# Patient Record
Sex: Female | Born: 1985 | Race: White | Hispanic: No
Health system: Midwestern US, Community
[De-identification: ages and names within clinical notes are randomized; demographics above are authoritative.]

## PROBLEM LIST (undated history)

## (undated) DIAGNOSIS — E079 Disorder of thyroid, unspecified: Secondary | ICD-10-CM

## (undated) DIAGNOSIS — F32A Depression, unspecified: Secondary | ICD-10-CM

## (undated) HISTORY — DX: Depression, unspecified: F32.A

## (undated) HISTORY — DX: Disorder of thyroid, unspecified: E07.9

---
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
## 2013-04-17 DIAGNOSIS — C50919 Malignant neoplasm of unspecified site of unspecified female breast: Secondary | ICD-10-CM

## 2013-04-17 HISTORY — PX: MASTECTOMY: SHX3

## 2013-04-17 HISTORY — DX: Malignant neoplasm of unspecified site of unspecified female breast: C50.919

## 2014-04-07 DIAGNOSIS — F17210 Nicotine dependence, cigarettes, uncomplicated: Secondary | ICD-10-CM | POA: Insufficient documentation

## 2014-04-17 DIAGNOSIS — G35 Multiple sclerosis: Secondary | ICD-10-CM

## 2014-04-20 DIAGNOSIS — E039 Hypothyroidism, unspecified: Secondary | ICD-10-CM | POA: Insufficient documentation

## 2014-05-19 DIAGNOSIS — Z7189 Other specified counseling: Secondary | ICD-10-CM | POA: Insufficient documentation

## 2014-10-14 DIAGNOSIS — Z9013 Acquired absence of bilateral breasts and nipples: Secondary | ICD-10-CM | POA: Insufficient documentation

## 2015-04-01 DIAGNOSIS — F32A Depression, unspecified: Secondary | ICD-10-CM | POA: Insufficient documentation

## 2015-04-01 DIAGNOSIS — C50911 Malignant neoplasm of unspecified site of right female breast: Secondary | ICD-10-CM | POA: Insufficient documentation

## 2015-07-22 DIAGNOSIS — N6320 Unspecified lump in the left breast, unspecified quadrant: Secondary | ICD-10-CM | POA: Insufficient documentation

## 2015-09-30 DIAGNOSIS — F419 Anxiety disorder, unspecified: Secondary | ICD-10-CM | POA: Insufficient documentation

## 2016-04-17 HISTORY — PX: BREAST RECONSTRUCTION: SHX9

## 2016-08-04 DIAGNOSIS — K219 Gastro-esophageal reflux disease without esophagitis: Secondary | ICD-10-CM | POA: Insufficient documentation

## 2016-08-04 DIAGNOSIS — Z87891 Personal history of nicotine dependence: Secondary | ICD-10-CM | POA: Insufficient documentation

## 2016-08-07 DIAGNOSIS — G4730 Sleep apnea, unspecified: Secondary | ICD-10-CM | POA: Insufficient documentation

## 2016-08-07 DIAGNOSIS — D649 Anemia, unspecified: Secondary | ICD-10-CM | POA: Insufficient documentation

## 2017-07-02 DIAGNOSIS — T8130XA Disruption of wound, unspecified, initial encounter: Secondary | ICD-10-CM | POA: Insufficient documentation

## 2017-07-02 DIAGNOSIS — Z853 Personal history of malignant neoplasm of breast: Secondary | ICD-10-CM | POA: Insufficient documentation

## 2017-07-02 DIAGNOSIS — T81321A Disruption or dehiscence of closure of internal operation (surgical) wound of abdominal wall muscle or fascia, initial encounter: Secondary | ICD-10-CM | POA: Insufficient documentation

## 2018-05-01 DIAGNOSIS — R9082 White matter disease, unspecified: Secondary | ICD-10-CM | POA: Insufficient documentation

## 2019-02-21 DIAGNOSIS — G8929 Other chronic pain: Secondary | ICD-10-CM | POA: Insufficient documentation

## 2019-04-18 DIAGNOSIS — G35 Multiple sclerosis: Secondary | ICD-10-CM

## 2019-04-18 HISTORY — DX: Multiple sclerosis: G35

## 2019-12-19 DIAGNOSIS — Z79899 Other long term (current) drug therapy: Secondary | ICD-10-CM | POA: Insufficient documentation

## 2019-12-19 DIAGNOSIS — G959 Disease of spinal cord, unspecified: Secondary | ICD-10-CM | POA: Insufficient documentation

## 2019-12-19 DIAGNOSIS — Z7960 Long term (current) use of unspecified immunomodulators and immunosuppressants: Secondary | ICD-10-CM | POA: Insufficient documentation

## 2020-01-23 DIAGNOSIS — D8489 Other immunodeficiencies: Secondary | ICD-10-CM | POA: Insufficient documentation

## 2020-02-23 DIAGNOSIS — F332 Major depressive disorder, recurrent severe without psychotic features: Secondary | ICD-10-CM | POA: Insufficient documentation

## 2020-02-23 DIAGNOSIS — F411 Generalized anxiety disorder: Secondary | ICD-10-CM | POA: Insufficient documentation

## 2020-08-02 ENCOUNTER — Ambulatory Visit: Attending: Family | Primary: Family

## 2020-08-02 ENCOUNTER — Ambulatory Visit: Admit: 2020-08-02 | Discharge: 2020-08-02 | Payer: PRIVATE HEALTH INSURANCE | Attending: Family | Primary: Family

## 2020-08-02 DIAGNOSIS — R238 Other skin changes: Secondary | ICD-10-CM | POA: Insufficient documentation

## 2020-08-02 DIAGNOSIS — F909 Attention-deficit hyperactivity disorder, unspecified type: Secondary | ICD-10-CM | POA: Insufficient documentation

## 2020-08-02 DIAGNOSIS — G35 Multiple sclerosis: Secondary | ICD-10-CM

## 2020-08-02 NOTE — Progress Notes (Signed)
Progress  Notes by Darleen Crocker, NP at 08/02/20 1300                Author: Darleen Crocker, NP  Service: --  Author Type: Nurse Practitioner       Filed: 08/03/20 1126  Encounter Date: 08/02/2020  Status: Signed          Editor: Darleen Crocker, NP (Nurse Practitioner)                    HPI          Chief Complaint       Patient presents with        ?  Establish Care             establish care, discuss mole on back and leg.            HPI:   April Torres is a 35 y.o.  female who is  Here to establish care with a new provider.       Last 6 months [facility] out of [place].       Breast cancer oncologist is from [place]. Hx of breast CA: Double mastectomy: Chemo completed.  Per the patient she was stage 2, now cancer  free. Finished chemotherapy in 2020.        Multiple sclerosis MS: Previously diagnosed with multiple sclerosis.  Patient was followed by neurology in her home state of [place].  Patient  states she was on gabapentin.  No recent relapses.  No urinary symptoms, no visual impairment, notes fatigue.      ADHD:   Patient  complaining of symptoms of inattention, impulsive activity, restlessness, functional impairment.         Insomnia:  Patient has not been using medication for insomnia. Patient has been limiting caffeine intake and stimulation.  Denies over sedation  during the day.  Gets less than 6-8 hours of sleep at night.       Hyperpigmentation : patient has several areas of hyperpigmentation noted to the left leg and shoulder.  Has not been evaluated for the same  in the past.  Patient endorses previous episodes of sunburn.  Patient is light complected        Allergies        Allergen  Reactions         ?  Compazine [Prochlorperazine]  Other (comments)             Lockjaw             Current Outpatient Medications        Medication  Sig         ?  venlafaxine-ER 24 HR (EFFEXOR-ER) 37.5 mg tr24 tablet  Take 37.5 mg by mouth daily.         ?  venlafaxine-SR (EFFEXOR-XR) 75 mg  capsule  Take 75 mg by mouth daily.          No current facility-administered medications for this visit.           Review of Systems    Constitutional: Negative for malaise/fatigue and weight loss.    Eyes: Negative for blurred vision and double vision.    Respiratory: Negative for cough and shortness of breath.     Cardiovascular: Negative for chest pain, palpitations and leg swelling.    Gastrointestinal: Negative for heartburn and nausea.    Musculoskeletal: Negative for joint pain and myalgias.    Skin: Negative  for itching and rash.    Neurological: Negative for dizziness, tingling, loss of consciousness, weakness and headaches.    Endo/Heme/Allergies: Does not bruise/bleed easily.    Psychiatric/Behavioral: Negative for depression. The patient is not nervous/anxious.     All other systems reviewed and are negative.         Reviewed PmHx, FmHx, SocHx as well as meds and allergies, updated and dated in the chart.              Objective        Visit Vitals      BP  117/73 (BP 1 Location: Right arm, BP Patient Position: Sitting, BP Cuff Size: Adult)        Pulse  76     Temp  98.3 ??F (36.8 ??C) (Oral)     Resp  16     Ht  5\' 5"  (1.651 m)     Wt  201 lb 1.6 oz (91.2 kg)     SpO2  99%        BMI  33.46 kg/m??        Physical Exam   Vitals and nursing note reviewed.   Constitutional :        Appearance: Normal appearance. She is normal weight.    HENT:       Head: Normocephalic.    Eyes:       Extraocular Movements: Extraocular movements intact.      Conjunctiva/sclera: Conjunctivae normal.      Pupils: Pupils are equal, round, and reactive to light.    Cardiovascular:       Rate and Rhythm: Normal rate and regular rhythm.      Pulses: Normal pulses.      Heart sounds: Normal heart sounds.   Pulmonary:       Effort: Pulmonary effort is normal.      Breath sounds: Normal breath sounds.   Musculoskeletal:          General: Normal range of motion.      Cervical back: Normal range of motion and neck supple.     Skin:       General: Skin is warm and dry.               Comments: Several areas of hypopigmentation which is a new finding for the patient.     Neurological:       General: No focal deficit present.      Mental Status: She is alert and oriented to person, place, and time.   Psychiatric:         Mood and Affect: Mood normal.                   Assessment and Plan        Diagnoses and all orders for this visit:      1. Multiple sclerosis (HCC)   Assessment & Plan:    well controlled, continue current medications      Orders:   -     REFERRAL TO NEUROLOGY      2. Abnormal skin color   Assessment & Plan:    unclear control, changes made today: Referral to dermatology.      Orders:   -     REFERRAL TO DERMATOLOGY      3. Attention deficit hyperactivity disorder (ADHD), unspecified ADHD type   Assessment & Plan:    uncontrolled, changes made today: ref to psy for testing anxiety vs ADHD  Orders:   -     REFERRAL TO PSYCHIATRY      4. Anxiety   Assessment & Plan:    borderline controlled, changes made today: Referral to psychiatry for testing anxiety versus ADHD.         5. Bilateral malignant neoplasm of breast in female, unspecified estrogen receptor status, unspecified site of breast [facility])   Assessment & Plan:    asymptomatic, continue current medications             Medication Side Effects and Warnings were discussed with patient.   Patient Labs were reviewed and or requested.   Patient Past Records were reviewed and or requested.        Follow-up and Dispositions      ??  Return in about 3 months (around 11/01/2020) for follow up.                Please note that this dictation was completed with Dragon, the computer voice recognition software.  Quite often unanticipated grammatical, syntax, homophones, and other interpretive errors are inadvertently transcribed by the computer software.  Please  disregard these errors.  Please excuse any errors that have escaped final proofreading.           I have discussed the diagnosis with  the patient and the intended plan as seen in the above orders. The patient has received an after-visit summary and questions were answered concerning future plans.  I have discussed medication side effects and warnings  with the patient as well.      Arrionna Serena M. Azucena Kuba, FNP   [address] [facility]   [id number] [address] [facility]

## 2020-08-03 NOTE — Assessment & Plan Note (Signed)
unclear control, changes made today: Referral to dermatology.

## 2020-08-03 NOTE — Assessment & Plan Note (Signed)
asymptomatic, continue current medications

## 2020-08-03 NOTE — Assessment & Plan Note (Signed)
borderline controlled, changes made today: Referral to psychiatry for testing anxiety versus ADHD.

## 2020-08-03 NOTE — Assessment & Plan Note (Signed)
uncontrolled, changes made today: ref to psy for testing anxiety vs ADHD

## 2020-08-03 NOTE — Assessment & Plan Note (Signed)
well controlled, continue current medications

## 2020-08-06 ENCOUNTER — Encounter: Attending: Family | Primary: Family

## 2020-08-17 DIAGNOSIS — E669 Obesity, unspecified: Secondary | ICD-10-CM | POA: Insufficient documentation

## 2020-08-17 DIAGNOSIS — F331 Major depressive disorder, recurrent, moderate: Secondary | ICD-10-CM | POA: Insufficient documentation

## 2020-08-24 DIAGNOSIS — R110 Nausea: Secondary | ICD-10-CM | POA: Insufficient documentation

## 2020-10-26 ENCOUNTER — Other Ambulatory Visit: Payer: Self-pay | Admitting: Surgery

## 2020-10-26 DIAGNOSIS — K838 Other specified diseases of biliary tract: Secondary | ICD-10-CM

## 2020-11-01 ENCOUNTER — Encounter: Attending: Family | Primary: Family

## 2020-11-09 ENCOUNTER — Other Ambulatory Visit (HOSPITAL_COMMUNITY): Payer: Self-pay | Admitting: Family Medicine

## 2020-11-09 ENCOUNTER — Other Ambulatory Visit: Payer: Self-pay | Admitting: Family Medicine

## 2020-11-09 DIAGNOSIS — R110 Nausea: Secondary | ICD-10-CM

## 2020-11-09 DIAGNOSIS — R1011 Right upper quadrant pain: Secondary | ICD-10-CM

## 2020-11-12 ENCOUNTER — Other Ambulatory Visit: Payer: Self-pay

## 2020-11-12 ENCOUNTER — Ambulatory Visit
Admission: RE | Admit: 2020-11-12 | Discharge: 2020-11-12 | Disposition: A | Discharge status: Home / Self Care | Payer: 59 | Source: Ambulatory Visit | Attending: Family Medicine | Admitting: Family Medicine

## 2020-11-12 DIAGNOSIS — R1011 Right upper quadrant pain: Secondary | ICD-10-CM | POA: Diagnosis not present

## 2020-11-12 DIAGNOSIS — R110 Nausea: Secondary | ICD-10-CM | POA: Diagnosis present

## 2020-12-06 ENCOUNTER — Ambulatory Visit: Payer: 59 | Admitting: Surgery

## 2020-12-06 DIAGNOSIS — F3341 Major depressive disorder, recurrent, in partial remission: Secondary | ICD-10-CM | POA: Insufficient documentation

## 2020-12-08 ENCOUNTER — Encounter: Payer: Self-pay | Admitting: Surgery

## 2020-12-08 ENCOUNTER — Other Ambulatory Visit: Payer: Self-pay

## 2020-12-08 ENCOUNTER — Telehealth: Payer: Self-pay | Admitting: Surgery

## 2020-12-08 ENCOUNTER — Ambulatory Visit: Payer: 59 | Admitting: Surgery

## 2020-12-08 VITALS — BP 109/75 | HR 89 | Temp 98.7°F | Ht 65.0 in | Wt 200.0 lb

## 2020-12-08 DIAGNOSIS — L732 Hidradenitis suppurativa: Secondary | ICD-10-CM | POA: Diagnosis not present

## 2020-12-08 NOTE — Telephone Encounter (Signed)
Patient has been advised of Pre-Admission date/time, COVID Testing date and Surgery date.  Surgery Date: 12/13/20 Preadmission Testing Date: 12/10/20 (phone 1p-5p) Covid Testing Date: Not needed.     Patient has been made aware to call [phone], between 1-3:00pm the day before surgery, to find out what time to arrive for surgery.

## 2020-12-08 NOTE — Patient Instructions (Signed)
We will refer you to Dermatology to see about care for Hidradenitis. They will call you to schedule this appointment.   We want you to go ahead and take your Bactrim prescription to keep down any infection prior to surgery.   We have spoken to you about having surgery. This will be done at [facility] by Dr Hampton Abbot.  Our surgery scheduler will call you to look at surgery dates and to go over information. Please see your Blue surgery sheet for more information.    Hidradenitis Suppurativa Hidradenitis suppurativa is a long-term (chronic) skin disease that starts with blocked sweat glands or hair follicles. Bacteria may grow in these blocked openings of your skin. Hidradenitis suppurativa is like a severe form of acne that develops in areas of your body where acne would be unusual. It is most likely to affect the areas of your body where skin rubs against skin and becomes moist. This includes your: Underarms. Groin. Genital areas. Buttocks. Upper thighs. Breasts. Hidradenitis suppurativa may start out with small pimples. The pimples can develop into deep sores that break open (rupture) and drain pus. Over time your skin may thicken and become scarred. Hidradenitis suppurativa cannot be passed from person to person.  CAUSES  The exact cause of hidradenitis suppurativa is not known. This condition may be due to: Female and female hormones. The condition is rare before and after puberty. An overactive body defense system (immune system). Your immune system may overreact to the blocked hair follicles or sweat glands and cause swelling and pus-filled sores. RISK FACTORS You may have a higher risk of hidradenitis suppurativa if you: Are a woman. Are between ages 79 and 78. Have a family history of hidradenitis suppurativa. Have a personal history of acne. Are overweight. Smoke. Take the drug lithium. SIGNS AND SYMPTOMS  The first signs of an outbreak are usually painful skin bumps that look like  pimples. As the condition progresses: Skin bumps may get bigger and grow deeper into the skin. Bumps under the skin may rupture and drain smelly pus. Skin may become itchy and infected. Skin may thicken and scar. Drainage may continue through tunnels under the skin (fistulas). Walking and moving your arms can become painful. DIAGNOSIS  Your health care provider may diagnose hidradenitis suppurativa based on your medical history and your signs and symptoms. A physical exam will also be done. You may need to see a health care provider who specializes in skin diseases (dermatologist). You may also have tests done to confirm the diagnosis. These can include: Swabbing a sample of pus or drainage from your skin so it can be sent to the lab and tested for infection. Blood tests to check for infection. TREATMENT  The same treatment will not work for everybody with hidradenitis suppurativa. Your treatment will depend on how severe your symptoms are. You may need to try several treatments to find what works best for you. Part of your treatment may include cleaning and bandaging (dressing) your wounds. You may also have to take medicines, such as the following: Antibiotics. Acne medicines. Medicines to block or suppress the immune system. A diabetes medicine (metformin) is sometimes used to treat this condition. For women, birth control pills can sometimes help relieve symptoms. You may need surgery if you have a severe case of hidradenitis suppurativa that does not respond to medicine. Surgery may involve:  Using a laser to clear the skin and remove hair follicles. Opening and draining deep sores. Removing the areas of skin that  are diseased and scarred. HOME CARE INSTRUCTIONS Learn as much as you can about your disease, and work closely with your health care providers. Take medicines only as directed by your health care provider. If you were prescribed an antibiotic medicine, finish it all even if  you start to feel better. If you are overweight, losing weight may be very helpful. Try to reach and maintain a healthy weight. Do not use any tobacco products, including cigarettes, chewing tobacco, or electronic cigarettes. If you need help quitting, ask your health care provider. Do not shave the areas where you get hidradenitis suppurativa. Do not wear deodorant. Wear loose-fitting clothes. Try not to overheat and get sweaty. Take a daily bleach bath as directed by your health care provider. Fill your bathtub halfway with water. Pour in  cup of unscented household bleach. Soak for 5-10 minutes. Cover sore areas with a warm, clean washcloth (compress) for 5-10 minutes. SEEK MEDICAL CARE IF:  You have a flare-up of hidradenitis suppurativa. You have chills or a fever. You are having trouble controlling your symptoms at home.   This information is not intended to replace advice given to you by your health care provider. Make sure you discuss any questions you have with your health care provider.   Document Released: 11/16/2003 Document Revised: 04/24/2014 Document Reviewed: 07/04/2013 Elsevier Interactive Patient Education [employer].

## 2020-12-08 NOTE — H&P (View-Only) (Signed)
12/08/2020  Reason for Visit:  Left thigh abscess  Referring Provider:  Doretha Imus, FNP  History of Present Illness: Brittney Hendrix is a 35 y.o. female presenting for evaluation of a left thigh abscess.  The patient reports that this is located in the upper inner left thigh, next to the perineum.  She reports that she has been dealing with this abscess for about a year, and at times it will flare up and then get better after treatment with antibiotic course.  It has never drained on its own and she has never had an I&D procedure done for it.  She reports other areas in the past that have flared in her axilla and under her breast, but this one is the only one that keeps recurring.  She recently had a flare-up and started taking Bactrim.  It improved and she has stopped the antibiotic for now.  Currently denies any pain and she almost canceled her appointment today as she reports the area is much better now.  Of note, she mentions that her father had multiple similar abscesses and wounds and required procedures for them.  She's unclear if there was ever a diagnosis of hidradenitis.  Past Medical History: Past Medical History:  Diagnosis Date   Breast cancer ([place]) 2015   left, BRCA neg   Depression    Multiple sclerosis ([place]) 2021   Thyroid disease      Past Surgical History: Past Surgical History:  Procedure Laterality Date   APPENDECTOMY  1999   BREAST RECONSTRUCTION Bilateral 2018   implants   MASTECTOMY Bilateral 2015   TONSILLECTOMY  2003    Home Medications: Prior to Admission medications   Medication Sig Start Date End Date Taking? Authorizing Provider  gabapentin (NEURONTIN) 100 MG capsule Take 200 mg by mouth 2 (two) times daily. 08/13/20  Yes [provider]  levothyroxine (SYNTHROID) 125 MCG tablet Take 125 mcg by mouth daily before breakfast.   Yes [provider]  ocrelizumab (OCREVUS) 300 MG/10ML injection Inject 300 mg into the vein every 6 (six)  months.   Yes [provider]  pantoprazole (PROTONIX) 40 MG tablet Take 40 mg by mouth daily. 11/21/20  Yes [provider]  PARoxetine (PAXIL) 20 MG tablet Take 20 mg by mouth at bedtime. 11/25/20  Yes [provider]  traZODone (DESYREL) 50 MG tablet Take 50 mg by mouth at bedtime. 11/13/20  Yes [provider]  acetaminophen (TYLENOL) 500 MG tablet Take 1,000 mg by mouth every 6 (six) hours as needed (for pain.).    [provider]  cyclobenzaprine (FLEXERIL) 5 MG tablet Take 5 mg by mouth 3 (three) times daily as needed for spasms. 12/07/20   [provider]  ibuprofen (ADVIL) 200 MG tablet Take 400 mg by mouth every 8 (eight) hours as needed (pain.).    [provider]  sulfamethoxazole-trimethoprim (BACTRIM DS) 800-160 MG tablet Take 1 tablet by mouth 2 (two) times daily. 11/25/20   [provider]    Allergies: Allergies  Allergen Reactions   Prochlorperazine Other (See Comments)    LOCK JAW     Nicotine Rash    Only when placed on skin    Social History:  reports that she has been smoking cigarettes. She has been smoking an average of 1 pack per day. She has never used smokeless tobacco. She reports current alcohol use. She reports that she does not use drugs.   Family History: No family history on file.  Review of Systems: Review of Systems  Constitutional:  Negative for chills and fever.  HENT:  Negative for hearing loss.   Respiratory:  Negative for shortness of breath.   Cardiovascular:  Negative for chest pain.  Gastrointestinal:  Negative for abdominal pain, nausea and vomiting.  Genitourinary:  Negative for dysuria.  Musculoskeletal:  Negative for myalgias.  Skin:        Abscess left inner upper thigh  Neurological:  Negative for dizziness.  Psychiatric/Behavioral:  Negative for depression.    Physical Exam BP 109/75   Pulse 89   Temp 98.7 F (37.1 C)   Ht 5' 5"  (1.651 m)   Wt 200 lb (90.7  kg)   LMP 11/15/2020   SpO2 97%   BMI 33.28 kg/m  CONSTITUTIONAL: No acute distress. HEENT:  Normocephalic, atraumatic, extraocular motion intact. NECK: Trachea is midline, and there is no jugular venous distension.  RESPIRATORY:  Lungs are clear, and breath sounds are equal bilaterally. Normal respiratory effort without pathologic use of accessory muscles. CARDIOVASCULAR: Heart is regular without murmurs, gallops, or rubs. MUSCULOSKELETAL:  Normal muscle strength and tone in all four extremities.  No peripheral edema or cyanosis. SKIN: The patient has two small areas that are next to each other measuring each about 1 cm in the upper inner thigh, lateral to the perineum, which appear to have been inflamed recently.  There is minimal residual erythema and no induration currently.  The more lateral of these two has a clogged pore consistent with changes of hidradenitis.  There is more medially closer to the anal verge, an additional small wound measuring about 2 mm with minimal purulent fluid, but no significant induration or erythema either, and no significant tenderness either. NEUROLOGIC:  Motor and sensation is grossly normal.  Cranial nerves are grossly intact. PSYCH:  Alert and oriented to person, place and time. Affect is normal.  Laboratory Analysis: Labs from 10/12/20: WBC 12, Hgb 14, Hct 40.2, Plt 253.  Na 136, K 4.2, Cl 103, CO2 26, BUN 13, Cr 0.7.  LFTs within normal.  Imaging: No results found.  Assessment and Plan: This is a 35 y.o. female with what appears to be hidradenitis of the upper inner thigh at the junction with the perineum.  --Discussed with the patient that based on her history and family history and current exam, she likely has hidradenitis.  Discussed what this is more with the patient and how different teams get involved in the care.  Given the recurrent abscess, discussed that we can proceed with excision of these areas of hidradenitis but she would also need a  Dermatology referral for maintenance care of her hidradenitis.  Discussed with her how this sometimes involves antibiotics or possibly immunosuppressants.  Unclear how her current MS and medications would affect the management.   --With her current areas, discussed the role of excision or possible I&D depending on the degree of infection.  I think based on location, it would be better to do this in the OR, particularly so the area closer to the anus can be better evaluated to make sure this is not a fistula.  Reviewed with her the surgery in length including the risks of bleeding, infection, injury to surrounding structures, dressing changes, and she's willing to proceed.  For now, also recommend continuing her antibiotics to complete treatment of the flare-up areas. --Will schedule her for 12/13/20.  Face-to-face time spent with the patient and care providers was 60 minutes, with more than 50% of the  time spent counseling, educating, and coordinating care of the patient.     Melvyn Neth, [facility]

## 2020-12-08 NOTE — Progress Notes (Signed)
12/08/2020  Reason for Visit:  Left thigh abscess  Referring Provider:  Doretha Imus, FNP  History of Present Illness: Brittney Hendrix is a 35 y.o. female presenting for evaluation of a left thigh abscess.  The patient reports that this is located in the upper inner left thigh, next to the perineum.  She reports that she has been dealing with this abscess for about a year, and at times it will flare up and then get better after treatment with antibiotic course.  It has never drained on its own and she has never had an I&D procedure done for it.  She reports other areas in the past that have flared in her axilla and under her breast, but this one is the only one that keeps recurring.  She recently had a flare-up and started taking Bactrim.  It improved and she has stopped the antibiotic for now.  Currently denies any pain and she almost canceled her appointment today as she reports the area is much better now.  Of note, she mentions that her father had multiple similar abscesses and wounds and required procedures for them.  She's unclear if there was ever a diagnosis of hidradenitis.  Past Medical History: Past Medical History:  Diagnosis Date   Breast cancer ([place]) 2015   left, BRCA neg   Depression    Multiple sclerosis ([place]) 2021   Thyroid disease      Past Surgical History: Past Surgical History:  Procedure Laterality Date   APPENDECTOMY  1999   BREAST RECONSTRUCTION Bilateral 2018   implants   MASTECTOMY Bilateral 2015   TONSILLECTOMY  2003    Home Medications: Prior to Admission medications   Medication Sig Start Date End Date Taking? Authorizing Provider  gabapentin (NEURONTIN) 100 MG capsule Take 200 mg by mouth 2 (two) times daily. 08/13/20  Yes [provider]  levothyroxine (SYNTHROID) 125 MCG tablet Take 125 mcg by mouth daily before breakfast.   Yes [provider]  ocrelizumab (OCREVUS) 300 MG/10ML injection Inject 300 mg into the vein every 6 (six)  months.   Yes [provider]  pantoprazole (PROTONIX) 40 MG tablet Take 40 mg by mouth daily. 11/21/20  Yes [provider]  PARoxetine (PAXIL) 20 MG tablet Take 20 mg by mouth at bedtime. 11/25/20  Yes [provider]  traZODone (DESYREL) 50 MG tablet Take 50 mg by mouth at bedtime. 11/13/20  Yes [provider]  acetaminophen (TYLENOL) 500 MG tablet Take 1,000 mg by mouth every 6 (six) hours as needed (for pain.).    [provider]  cyclobenzaprine (FLEXERIL) 5 MG tablet Take 5 mg by mouth 3 (three) times daily as needed for spasms. 12/07/20   [provider]  ibuprofen (ADVIL) 200 MG tablet Take 400 mg by mouth every 8 (eight) hours as needed (pain.).    [provider]  sulfamethoxazole-trimethoprim (BACTRIM DS) 800-160 MG tablet Take 1 tablet by mouth 2 (two) times daily. 11/25/20   [provider]    Allergies: Allergies  Allergen Reactions   Prochlorperazine Other (See Comments)    LOCK JAW     Nicotine Rash    Only when placed on skin    Social History:  reports that she has been smoking cigarettes. She has been smoking an average of 1 pack per day. She has never used smokeless tobacco. She reports current alcohol use. She reports that she does not use drugs.   Family History: No family history on file.  Review of Systems: Review of Systems  Constitutional:  Negative for chills and fever.  HENT:  Negative for hearing loss.   Respiratory:  Negative for shortness of breath.   Cardiovascular:  Negative for chest pain.  Gastrointestinal:  Negative for abdominal pain, nausea and vomiting.  Genitourinary:  Negative for dysuria.  Musculoskeletal:  Negative for myalgias.  Skin:        Abscess left inner upper thigh  Neurological:  Negative for dizziness.  Psychiatric/Behavioral:  Negative for depression.    Physical Exam BP 109/75   Pulse 89   Temp 98.7 F (37.1 C)   Ht 5' 5"  (1.651 m)   Wt 200 lb (90.7  kg)   LMP 11/15/2020   SpO2 97%   BMI 33.28 kg/m  CONSTITUTIONAL: No acute distress. HEENT:  Normocephalic, atraumatic, extraocular motion intact. NECK: Trachea is midline, and there is no jugular venous distension.  RESPIRATORY:  Lungs are clear, and breath sounds are equal bilaterally. Normal respiratory effort without pathologic use of accessory muscles. CARDIOVASCULAR: Heart is regular without murmurs, gallops, or rubs. MUSCULOSKELETAL:  Normal muscle strength and tone in all four extremities.  No peripheral edema or cyanosis. SKIN: The patient has two small areas that are next to each other measuring each about 1 cm in the upper inner thigh, lateral to the perineum, which appear to have been inflamed recently.  There is minimal residual erythema and no induration currently.  The more lateral of these two has a clogged pore consistent with changes of hidradenitis.  There is more medially closer to the anal verge, an additional small wound measuring about 2 mm with minimal purulent fluid, but no significant induration or erythema either, and no significant tenderness either. NEUROLOGIC:  Motor and sensation is grossly normal.  Cranial nerves are grossly intact. PSYCH:  Alert and oriented to person, place and time. Affect is normal.  Laboratory Analysis: Labs from 10/12/20: WBC 12, Hgb 14, Hct 40.2, Plt 253.  Na 136, K 4.2, Cl 103, CO2 26, BUN 13, Cr 0.7.  LFTs within normal.  Imaging: No results found.  Assessment and Plan: This is a 35 y.o. female with what appears to be hidradenitis of the upper inner thigh at the junction with the perineum.  --Discussed with the patient that based on her history and family history and current exam, she likely has hidradenitis.  Discussed what this is more with the patient and how different teams get involved in the care.  Given the recurrent abscess, discussed that we can proceed with excision of these areas of hidradenitis but she would also need a  Dermatology referral for maintenance care of her hidradenitis.  Discussed with her how this sometimes involves antibiotics or possibly immunosuppressants.  Unclear how her current MS and medications would affect the management.   --With her current areas, discussed the role of excision or possible I&D depending on the degree of infection.  I think based on location, it would be better to do this in the OR, particularly so the area closer to the anus can be better evaluated to make sure this is not a fistula.  Reviewed with her the surgery in length including the risks of bleeding, infection, injury to surrounding structures, dressing changes, and she's willing to proceed.  For now, also recommend continuing her antibiotics to complete treatment of the flare-up areas. --Will schedule her for 12/13/20.  Face-to-face time spent with the patient and care providers was 60 minutes, with more than 50% of the  time spent counseling, educating, and coordinating care of the patient.     Melvyn Neth, [facility]

## 2020-12-10 ENCOUNTER — Other Ambulatory Visit: Payer: Self-pay

## 2020-12-10 ENCOUNTER — Encounter
Admission: RE | Admit: 2020-12-10 | Discharge: 2020-12-10 | Disposition: A | Discharge status: Home / Self Care | Payer: 59 | Source: Ambulatory Visit | Attending: Surgery | Admitting: Surgery

## 2020-12-10 NOTE — Patient Instructions (Signed)
Your procedure is scheduled on: 12/13/2020 Report to the Registration Desk on the 1st floor of the [facility]. To find out your arrival time, please call [phone] between 1PM - 3PM on: 12/10/2020  REMEMBER: Instructions that are not followed completely may result in serious medical risk, up to and including death; or upon the discretion of your surgeon and anesthesiologist your surgery may need to be rescheduled.  Do not eat food after midnight the night before surgery.  No gum chewing, lozengers or hard candies.  You may however, drink CLEAR liquids up to 2 hours before you are scheduled to arrive for your surgery. Do not drink anything within 2 hours of your scheduled arrival time.  Clear liquids include: - water  - apple juice without pulp - gatorade (not RED, PURPLE, OR BLUE) - black coffee or tea (Do NOT add milk or creamers to the coffee or tea) Do NOT drink anything that is not on this list.  TAKE THESE MEDICATIONS THE MORNING OF SURGERY WITH A SIP OF WATER:  Protonix (take one the night before and one on the morning of surgery - helps to prevent nausea after surgery.) 2. Flexeril 3. Gabapentin 4. Synthroid      One week prior to surgery: Stop Anti-inflammatories (NSAIDS) such as Advil, Aleve, Ibuprofen, Motrin, Naproxen, Naprosyn and Aspirin based products such as Excedrin, Goodys Powder, BC Powder. Stop ANY OVER THE COUNTER supplements until after surgery. You may however, continue to take Tylenol if needed for pain up until the day of surgery.  No Alcohol for 24 hours before or after surgery.  No Smoking including e-cigarettes for 24 hours prior to surgery.  No chewable tobacco products for at least 6 hours prior to surgery.  No nicotine patches on the day of surgery.  Do not use any "recreational" drugs for at least a week prior to your surgery.  Please be advised that the combination of cocaine and anesthesia may have negative outcomes, up to and including  death. If you test positive for cocaine, your surgery will be cancelled.  On the morning of surgery brush your teeth with toothpaste and water, you may rinse your mouth with mouthwash if you wish. Do not swallow any toothpaste or mouthwash.  Do not wear jewelry, make-up, hairpins, clips or nail polish.  Do not wear lotions, powders, or perfumes.   Do not shave body from the neck down 48 hours prior to surgery just in case you cut yourself which could leave a site for infection.  Also, freshly shaved skin may become irritated if using the CHG soap.  Contact lenses, hearing aids and dentures may not be worn into surgery.  Do not bring valuables to the hospital. [facility] is not responsible for any missing/lost belongings or valuables.   Use CHG Soap or wipes as directed on instruction sheet.   Notify your doctor if there is any change in your medical condition (cold, fever, infection).  Wear comfortable clothing (specific to your surgery type) to the hospital.  After surgery, you can help prevent lung complications by doing breathing exercises.  Take deep breaths and cough every 1-2 hours. Your doctor may order a device called an Incentive Spirometer to help you take deep breaths.  If you are being admitted to the hospital overnight, leave your suitcase in the car. After surgery it may be brought to your room.  If you are being discharged the day of surgery, you will not be allowed to drive home. You  will need a responsible adult (18 years or older) to drive you home and stay with you that night.   If you are taking public transportation, you will need to have a responsible adult (18 years or older) with you. Please confirm with your physician that it is acceptable to use public transportation.   Please call the [facility] at [phone] if you have any questions about these instructions.  Surgery Visitation Policy:  Patients undergoing a surgery or  procedure may have one family member or support person with them as long as that person is not COVID-19 positive or experiencing its symptoms.  That person may remain in the waiting area during the procedure.  Inpatient Visitation:    Visiting hours are 7 a.m. to 8 p.m. Inpatients will be allowed two visitors daily. The visitors may change each day during the patient's stay. No visitors under the age of 82. Any visitor under the age of 28 must be accompanied by an adult. The visitor must pass COVID-19 screenings, use hand sanitizer when entering and exiting the patient's room and wear a mask at all times, including in the patient's room. Patients must also wear a mask when staff or their visitor are in the room. Masking is required regardless of vaccination status.

## 2020-12-10 NOTE — Pre-Procedure Instructions (Signed)
Pt. doe snot have advance directives. Next of Kin is the Greg Cutter, [phone] ) He lives in [place].

## 2020-12-13 ENCOUNTER — Encounter: Payer: Self-pay | Admitting: Surgery

## 2020-12-13 ENCOUNTER — Ambulatory Visit: Payer: 59 | Admitting: Certified Registered"

## 2020-12-13 ENCOUNTER — Other Ambulatory Visit: Payer: Self-pay

## 2020-12-13 ENCOUNTER — Encounter
Admission: RE | Disposition: A | Discharge status: Home / Self Care | Payer: Self-pay | Source: Home / Self Care | Attending: Surgery

## 2020-12-13 ENCOUNTER — Ambulatory Visit
Admission: RE | Admit: 2020-12-13 | Discharge: 2020-12-13 | Disposition: A | Discharge status: Home / Self Care | Payer: 59 | Attending: Surgery | Admitting: Surgery

## 2020-12-13 DIAGNOSIS — Z79890 Hormone replacement therapy (postmenopausal): Secondary | ICD-10-CM | POA: Diagnosis not present

## 2020-12-13 DIAGNOSIS — Z853 Personal history of malignant neoplasm of breast: Secondary | ICD-10-CM | POA: Diagnosis not present

## 2020-12-13 DIAGNOSIS — Z79899 Other long term (current) drug therapy: Secondary | ICD-10-CM | POA: Insufficient documentation

## 2020-12-13 DIAGNOSIS — F17210 Nicotine dependence, cigarettes, uncomplicated: Secondary | ICD-10-CM | POA: Diagnosis not present

## 2020-12-13 DIAGNOSIS — L732 Hidradenitis suppurativa: Secondary | ICD-10-CM

## 2020-12-13 HISTORY — PX: HYDRADENITIS EXCISION: SHX5243

## 2020-12-13 LAB — POCT PREGNANCY, URINE: Preg Test, Ur: NEGATIVE

## 2020-12-13 SURGERY — EXCISION, HIDRADENITIS, INGUINAL REGION
Anesthesia: General | Laterality: Bilateral

## 2020-12-13 MED ORDER — ROCURONIUM BROMIDE 10 MG/ML (PF) SYRINGE
PREFILLED_SYRINGE | INTRAVENOUS | Status: AC
  Filled 2020-12-13: qty 10

## 2020-12-13 MED ORDER — CIPROFLOXACIN IN D5W 400 MG/200ML IV SOLN
400.0000 mg | INTRAVENOUS | Status: AC
  Administered 2020-12-13: 400 mg via INTRAVENOUS

## 2020-12-13 MED ORDER — GABAPENTIN 300 MG PO CAPS: 300.0000 mg | ORAL_CAPSULE | ORAL | Status: AC

## 2020-12-13 MED ORDER — FENTANYL CITRATE (PF) 100 MCG/2ML IJ SOLN
INTRAMUSCULAR | Status: AC
  Filled 2020-12-13: qty 2

## 2020-12-13 MED ORDER — BUPIVACAINE LIPOSOME 1.3 % IJ SUSP: 20.0000 mL | Freq: Once | INTRAMUSCULAR | Status: DC

## 2020-12-13 MED ORDER — PROPOFOL 500 MG/50ML IV EMUL
INTRAVENOUS | Status: AC
  Filled 2020-12-13: qty 50

## 2020-12-13 MED ORDER — IBUPROFEN 800 MG PO TABS: 800.0000 mg | ORAL_TABLET | Freq: Three times a day (TID) | ORAL | 1 refills | Status: AC | PRN

## 2020-12-13 MED ORDER — LIDOCAINE HCL (PF) 2 % IJ SOLN
INTRAMUSCULAR | Status: AC
  Filled 2020-12-13: qty 5

## 2020-12-13 MED ORDER — PROPOFOL 10 MG/ML IV BOLUS
INTRAVENOUS | Status: DC | PRN
  Administered 2020-12-13: 200 mg via INTRAVENOUS

## 2020-12-13 MED ORDER — LACTATED RINGERS IV SOLN: INTRAVENOUS | Status: DC

## 2020-12-13 MED ORDER — CHLORHEXIDINE GLUCONATE CLOTH 2 % EX PADS
6.0000 | MEDICATED_PAD | Freq: Once | CUTANEOUS | Status: AC
  Administered 2020-12-13: 6 via TOPICAL

## 2020-12-13 MED ORDER — ONDANSETRON HCL 4 MG/2ML IJ SOLN
INTRAMUSCULAR | Status: DC | PRN
  Administered 2020-12-13: 4 mg via INTRAVENOUS

## 2020-12-13 MED ORDER — PROPOFOL 500 MG/50ML IV EMUL
INTRAVENOUS | Status: DC | PRN
Start: 2020-12-13 — End: 2020-12-13
  Administered 2020-12-13: 150 ug/kg/min via INTRAVENOUS

## 2020-12-13 MED ORDER — DEXMEDETOMIDINE (PRECEDEX) IN NS 20 MCG/5ML (4 MCG/ML) IV SYRINGE
PREFILLED_SYRINGE | INTRAVENOUS | Status: DC | PRN
  Administered 2020-12-13: 12 ug via INTRAVENOUS
  Administered 2020-12-13: 8 ug via INTRAVENOUS

## 2020-12-13 MED ORDER — METRONIDAZOLE 500 MG/100ML IV SOLN
500.0000 mg | INTRAVENOUS | Status: AC
Start: 2020-12-13 — End: 2020-12-13
  Administered 2020-12-13: 500 mg via INTRAVENOUS
  Filled 2020-12-13: qty 100

## 2020-12-13 MED ORDER — DEXAMETHASONE SODIUM PHOSPHATE 10 MG/ML IJ SOLN
INTRAMUSCULAR | Status: DC | PRN
  Administered 2020-12-13: 10 mg via INTRAVENOUS

## 2020-12-13 MED ORDER — GABAPENTIN 300 MG PO CAPS
ORAL_CAPSULE | ORAL | Status: AC
  Administered 2020-12-13: 300 mg via ORAL
  Filled 2020-12-13: qty 1

## 2020-12-13 MED ORDER — PROPOFOL 10 MG/ML IV BOLUS
INTRAVENOUS | Status: AC
  Filled 2020-12-13: qty 20

## 2020-12-13 MED ORDER — ACETAMINOPHEN 500 MG PO TABS: 1000.0000 mg | ORAL_TABLET | ORAL | Status: AC

## 2020-12-13 MED ORDER — CHLORHEXIDINE GLUCONATE 0.12 % MT SOLN
OROMUCOSAL | Status: AC
  Administered 2020-12-13: 15 mL via OROMUCOSAL
  Filled 2020-12-13: qty 15

## 2020-12-13 MED ORDER — 0.9 % SODIUM CHLORIDE (POUR BTL) OPTIME
TOPICAL | Status: DC | PRN
  Administered 2020-12-13: 500 mL

## 2020-12-13 MED ORDER — SODIUM CHLORIDE FLUSH 0.9 % IV SOLN
INTRAVENOUS | Status: AC
  Filled 2020-12-13: qty 10

## 2020-12-13 MED ORDER — ORAL CARE MOUTH RINSE: 15.0000 mL | Freq: Once | OROMUCOSAL | Status: AC

## 2020-12-13 MED ORDER — MIDAZOLAM HCL 5 MG/5ML IJ SOLN
INTRAMUSCULAR | Status: DC | PRN
  Administered 2020-12-13: 2 mg via INTRAVENOUS

## 2020-12-13 MED ORDER — BUPIVACAINE LIPOSOME 1.3 % IJ SUSP
INTRAMUSCULAR | Status: DC | PRN
  Administered 2020-12-13: 50 mL

## 2020-12-13 MED ORDER — ONDANSETRON HCL 4 MG/2ML IJ SOLN
INTRAMUSCULAR | Status: AC
  Filled 2020-12-13: qty 2

## 2020-12-13 MED ORDER — CIPROFLOXACIN IN D5W 400 MG/200ML IV SOLN
INTRAVENOUS | Status: AC
  Filled 2020-12-13: qty 200

## 2020-12-13 MED ORDER — OXYCODONE HCL 5 MG PO TABS: 5.0000 mg | ORAL_TABLET | Freq: Four times a day (QID) | ORAL | 0 refills | Status: DC | PRN

## 2020-12-13 MED ORDER — BUPIVACAINE HCL (PF) 0.5 % IJ SOLN
INTRAMUSCULAR | Status: AC
  Filled 2020-12-13: qty 30

## 2020-12-13 MED ORDER — BUPIVACAINE LIPOSOME 1.3 % IJ SUSP
INTRAMUSCULAR | Status: AC
  Filled 2020-12-13: qty 20

## 2020-12-13 MED ORDER — OXYCODONE HCL 5 MG/5ML PO SOLN
5.0000 mg | Freq: Once | ORAL | Status: AC | PRN
Start: 2020-12-13 — End: 2020-12-13

## 2020-12-13 MED ORDER — FENTANYL CITRATE (PF) 100 MCG/2ML IJ SOLN
INTRAMUSCULAR | Status: DC | PRN
  Administered 2020-12-13 (×4): 50 ug via INTRAVENOUS

## 2020-12-13 MED ORDER — EPHEDRINE 5 MG/ML INJ
INTRAVENOUS | Status: AC
  Filled 2020-12-13: qty 5

## 2020-12-13 MED ORDER — EPHEDRINE SULFATE 50 MG/ML IJ SOLN
INTRAMUSCULAR | Status: DC | PRN
  Administered 2020-12-13: 10 mg via INTRAVENOUS

## 2020-12-13 MED ORDER — OXYCODONE HCL 5 MG PO TABS
5.0000 mg | ORAL_TABLET | Freq: Once | ORAL | Status: AC | PRN
  Administered 2020-12-13: 5 mg via ORAL

## 2020-12-13 MED ORDER — GLYCOPYRROLATE 0.2 MG/ML IJ SOLN
INTRAMUSCULAR | Status: DC | PRN
  Administered 2020-12-13: .2 mg via INTRAVENOUS

## 2020-12-13 MED ORDER — OXYCODONE HCL 5 MG PO TABS
ORAL_TABLET | ORAL | Status: AC
  Filled 2020-12-13: qty 1

## 2020-12-13 MED ORDER — DEXAMETHASONE SODIUM PHOSPHATE 10 MG/ML IJ SOLN
INTRAMUSCULAR | Status: AC
  Filled 2020-12-13: qty 1

## 2020-12-13 MED ORDER — FENTANYL CITRATE (PF) 100 MCG/2ML IJ SOLN
25.0000 ug | INTRAMUSCULAR | Status: DC | PRN
  Administered 2020-12-13 (×2): 25 ug via INTRAVENOUS

## 2020-12-13 MED ORDER — CHLORHEXIDINE GLUCONATE 0.12 % MT SOLN: 15.0000 mL | Freq: Once | OROMUCOSAL | Status: AC

## 2020-12-13 MED ORDER — FENTANYL CITRATE (PF) 100 MCG/2ML IJ SOLN
INTRAMUSCULAR | Status: AC
  Administered 2020-12-13: 25 ug via INTRAVENOUS
  Filled 2020-12-13: qty 2

## 2020-12-13 MED ORDER — ACETAMINOPHEN 500 MG PO TABS
ORAL_TABLET | ORAL | Status: AC
  Administered 2020-12-13: 1000 mg via ORAL
  Filled 2020-12-13: qty 2

## 2020-12-13 MED ORDER — MIDAZOLAM HCL 2 MG/2ML IJ SOLN
INTRAMUSCULAR | Status: AC
  Filled 2020-12-13: qty 2

## 2020-12-13 MED ORDER — GLYCOPYRROLATE 0.2 MG/ML IJ SOLN
INTRAMUSCULAR | Status: AC
  Filled 2020-12-13: qty 1

## 2020-12-13 MED ORDER — LIDOCAINE HCL (PF) 2 % IJ SOLN
INTRAMUSCULAR | Status: DC | PRN
  Administered 2020-12-13: 50 mg

## 2020-12-13 SURGICAL SUPPLY — 32 items
ADH SKN CLS APL DERMABOND .7 (GAUZE/BANDAGES/DRESSINGS) ×1
APL PRP STRL LF DISP 70% ISPRP (MISCELLANEOUS)
CHLORAPREP W/TINT 26 (MISCELLANEOUS) IMPLANT
DERMABOND ADVANCED (GAUZE/BANDAGES/DRESSINGS) ×1
DERMABOND ADVANCED .7 DNX12 (GAUZE/BANDAGES/DRESSINGS) ×1 IMPLANT
DRAPE 3/4 80X56 (DRAPES) IMPLANT
DRAPE LAPAROTOMY 100X77 ABD (DRAPES) ×2 IMPLANT
ELECT CAUTERY BLADE TIP 2.5 (TIP) ×2
ELECT REM PT RETURN 9FT ADLT (ELECTROSURGICAL) ×2
ELECTRODE CAUTERY BLDE TIP 2.5 (TIP) ×1 IMPLANT
ELECTRODE REM PT RTRN 9FT ADLT (ELECTROSURGICAL) ×1 IMPLANT
GAUZE 4X4 16PLY ~~LOC~~+RFID DBL (SPONGE) ×2 IMPLANT
GLOVE SURG SYN 7.0 (GLOVE) ×2 IMPLANT
GLOVE SURG SYN 7.5  E (GLOVE) ×1
GLOVE SURG SYN 7.5 E (GLOVE) ×1 IMPLANT
GOWN STRL REUS W/ TWL LRG LVL3 (GOWN DISPOSABLE) ×2 IMPLANT
GOWN STRL REUS W/TWL LRG LVL3 (GOWN DISPOSABLE) ×4
KIT TURNOVER KIT A (KITS) ×2 IMPLANT
LABEL OR SOLS (LABEL) ×2 IMPLANT
MANIFOLD NEPTUNE II (INSTRUMENTS) ×2 IMPLANT
NEEDLE HYPO 22GX1.5 SAFETY (NEEDLE) ×2 IMPLANT
NS IRRIG 1000ML POUR BTL (IV SOLUTION) IMPLANT
NS IRRIG 500ML POUR BTL (IV SOLUTION) ×2 IMPLANT
PACK BASIN MINOR ARMC (MISCELLANEOUS) ×2 IMPLANT
SUT MNCRL 4-0 (SUTURE) ×4
SUT MNCRL 4-0 27XMFL (SUTURE) ×2
SUT VIC AB 0 SH 27 (SUTURE) IMPLANT
SUT VIC AB 3-0 SH 27 (SUTURE) ×4
SUT VIC AB 3-0 SH 27X BRD (SUTURE) ×2 IMPLANT
SUTURE MNCRL 4-0 27XMF (SUTURE) ×2 IMPLANT
SYR 30ML LL (SYRINGE) ×2 IMPLANT
WATER STERILE IRR 500ML POUR (IV SOLUTION) IMPLANT

## 2020-12-13 NOTE — Anesthesia Postprocedure Evaluation (Signed)
Anesthesia Post Note  Patient: Brittney Hendrix  Procedure(s) Performed: EXAM UNDER ANESTHESIA, EXCISION OF LEFT UPPER INNER THIGH HIDRADENITIS, EXCISION OF RIGHT UPPER THIGH HIDRADENITIS (Bilateral)  Patient location during evaluation: PACU Anesthesia Type: General Level of consciousness: awake and alert Pain management: pain level controlled Vital Signs Assessment: post-procedure vital signs reviewed and stable Respiratory status: spontaneous breathing, nonlabored ventilation, respiratory function stable and patient connected to nasal cannula oxygen Cardiovascular status: blood pressure returned to baseline and stable Postop Assessment: no apparent nausea or vomiting Anesthetic complications: no   No notable events documented.   Last Vitals:  Vitals:   12/13/20 1421 12/13/20 1430  BP: (!) 91/59 (!) 87/49  Pulse: 69 66  Resp: 20 17  Temp:    SpO2: 98% 96%    Last Pain:  Vitals:   12/13/20 1430  PainSc: Asleep                 Brittney Hendrix

## 2020-12-13 NOTE — Discharge Instructions (Signed)

## 2020-12-13 NOTE — Interval H&P Note (Signed)
History and Physical Interval Note:  12/13/2020 11:24 AM  Brittney Hendrix  has presented today for surgery, with the diagnosis of hidradenitis left groin / thigh.  The various methods of treatment have been discussed with the patient and family. After consideration of risks, benefits and other options for treatment, the patient has consented to  Procedure(s): EXCISION HIDRADENITIS GROIN, bilateral inner thighs as a surgical intervention.  The patient's history has been reviewed, patient examined, no change in status, stable for surgery.  I have reviewed the patient's chart and labs.  Questions were answered to the patient's satisfaction.     Shalini Mair

## 2020-12-13 NOTE — Transfer of Care (Signed)
Immediate Anesthesia Transfer of Care Note  Patient: Margreta Journey  Procedure(s) Performed: EXAM UNDER ANESTHESIA, EXCISION OF LEFT UPPER INNER THIGH HIDRADENITIS, EXCISION OF RIGHT UPPER THIGH HIDRADENITIS (Bilateral)  Patient Location: PACU  Anesthesia Type:General  Level of Consciousness: awake and alert   Airway & Oxygen Therapy: Patient Spontanous Breathing and Patient connected to face mask oxygen  Post-op Assessment: Report given to RN and Post -op Vital signs reviewed and stable  Post vital signs: Reviewed  Last Vitals:  Vitals Value Taken Time  BP    Temp    Pulse    Resp    SpO2      Last Pain:  Vitals:   12/13/20 1047  PainSc: 0-No pain         Complications: No notable events documented.

## 2020-12-13 NOTE — Anesthesia Procedure Notes (Signed)
Procedure Name: LMA Insertion Date/Time: 12/13/2020 11:59 AM Performed by: Rolla Plate, CRNA Pre-anesthesia Checklist: Patient identified, Patient being monitored, Timeout performed, Emergency Drugs available and Suction available Patient Re-evaluated:Patient Re-evaluated prior to induction Oxygen Delivery Method: Circle system utilized Preoxygenation: Pre-oxygenation with 100% oxygen Induction Type: IV induction Ventilation: Mask ventilation without difficulty LMA: LMA inserted LMA Size: 4.0 Tube type: Oral Number of attempts: 1 Placement Confirmation: positive ETCO2 and breath sounds checked- equal and bilateral Tube secured with: Tape Dental Injury: Teeth and Oropharynx as per pre-operative assessment

## 2020-12-13 NOTE — Anesthesia Preprocedure Evaluation (Signed)
Anesthesia Evaluation  Patient identified by MRN, date of birth, ID band Patient awake    Reviewed: Allergy & Precautions, NPO status , Patient's Chart, lab work & pertinent test results  History of Anesthesia Complications Negative for: history of anesthetic complications  Airway Mallampati: III  TM Distance: >3 FB Neck ROM: full    Dental  (+) Chipped   Pulmonary neg shortness of breath, Current Smoker and Patient abstained from smoking.,    Pulmonary exam normal        Cardiovascular Exercise Tolerance: Good (-) angina(-) Past MI and (-) DOE negative cardio ROS Normal cardiovascular exam     Neuro/Psych PSYCHIATRIC DISORDERS negative neurological ROS  negative psych ROS   GI/Hepatic negative GI ROS, Neg liver ROS, neg GERD  ,  Endo/Other  negative endocrine ROS  Renal/GU      Musculoskeletal   Abdominal   Peds  Hematology negative hematology ROS (+)   Anesthesia Other Findings Past Medical History: 2015: Breast cancer ([place])     Comment:  left, BRCA neg No date: Depression 2021: Multiple sclerosis ([place]) No date: Thyroid disease  Past Surgical History: 1999: APPENDECTOMY 2018: BREAST RECONSTRUCTION; Bilateral     Comment:  implants 2015: MASTECTOMY; Bilateral 2003: TONSILLECTOMY     Reproductive/Obstetrics negative OB ROS                             Anesthesia Physical Anesthesia Plan  ASA: 3  Anesthesia Plan: General LMA   Post-op Pain Management:    Induction: Intravenous  PONV Risk Score and Plan: Dexamethasone, Ondansetron, Midazolam and Treatment may vary due to age or medical condition  Airway Management Planned: LMA  Additional Equipment:   Intra-op Plan:   Post-operative Plan: Extubation in OR  Informed Consent: I have reviewed the patients History and Physical, chart, labs and discussed the procedure including the risks, benefits and alternatives for  the proposed anesthesia with the patient or authorized representative who has indicated his/her understanding and acceptance.     Dental Advisory Given  Plan Discussed with: Anesthesiologist, CRNA and Surgeon  Anesthesia Plan Comments: (Patient consented for risks of anesthesia including but not limited to:  - adverse reactions to medications - damage to eyes, teeth, lips or other oral mucosa - nerve damage due to positioning  - sore throat or hoarseness - Damage to heart, brain, nerves, lungs, other parts of body or loss of life  Patient voiced understanding.)        Anesthesia Quick Evaluation

## 2020-12-13 NOTE — Op Note (Signed)
  Procedure Date:  12/13/2020  Pre-operative Diagnosis:  Hidradenitis of left perineum, left upper inner thigh, and right upper inner thigh.  Post-operative Diagnosis: Hidradenitis of left perineum, left upper inner thigh, and right upper inner thigh.  Procedure:  Incision and Drainage of Hidradenitis of left perineum, left upper inner thigh, and right upper inner thigh.  Surgeon:  Melvyn Neth, MD  Anesthesia:  General endotracheal  Estimated Blood Loss:  10 ml  Specimens:   Right upper thigh hidradenitis Left upper thigh hidradenitis Left perineum hidradenitis  Complications:  None  Indications for Procedure:  This is a 35 y.o. female with diagnosis of hidradenitis of the left upper inner thigh, left perineum, and right upper inner thigh.   She would like these areas excised.  The risks of bleeding, abscess or infection, injury to surrounding structures, and need for further procedures were all discussed with the patient and was willing to proceed.  Description of Procedure: The patient was correctly identified in the preoperative area and brought into the operating room.  The patient was placed supine with VTE prophylaxis in place.  Appropriate time-outs were performed.  Anesthesia was induced and the patient was intubated.  Appropriate antibiotics were infused.  The patient was then placed in high lithotomy position.  The patient's bilateral upper inner thighs and perineum were prepped and draped in usual sterile fashion.  Started with the right upper inner thigh.  An elliptical 3 cm incision was made over the area of concern, encompassing the entire cyst.  Cautery was used to dissect down to the subcutaneous tissue, and the lesion was excised intact.  Cautery was used for hemostasis.  Then proceeded to do the same in the left upper inner thigh with a 4 cm elliptical incision, using scalpel and cautery in the same fashion.  Lastly, the left perineum hidradenitis was also excised  with a 3 cm elliptical incision using scalpel and cautery.  All three specimens were sent to pathology.  The wounds were then irrigated.  50 ml total of Exparel solution mixed with 0.5% bupivacaine were infiltrated through each incision.  The wounds were then closed in layers using 3-0 Vicryl and 4-0 Monocryl.  The area was cleaned and the incisions sealed with DermaBond.   The patient was then placed back in flat supine position, emerged from anesthesia, extubated, and brought to the recovery room for further management.  The patient tolerated the procedure well and all counts were correct at the end of the case.   Melvyn Neth, MD

## 2020-12-14 ENCOUNTER — Encounter: Payer: Self-pay | Admitting: Surgery

## 2020-12-14 LAB — SURGICAL PATHOLOGY

## 2020-12-15 ENCOUNTER — Other Ambulatory Visit: Payer: Self-pay

## 2020-12-15 ENCOUNTER — Encounter: Payer: Self-pay | Admitting: Surgery

## 2020-12-15 ENCOUNTER — Telehealth: Payer: Self-pay | Admitting: Surgery

## 2020-12-15 MED ORDER — AMOXICILLIN-POT CLAVULANATE 875-125 MG PO TABS: 1.0000 | ORAL_TABLET | Freq: Two times a day (BID) | ORAL | 0 refills | Status: DC

## 2020-12-15 NOTE — Telephone Encounter (Signed)
Patient is calling and is asking if one of the nurses would give her a call. Please call patient and advise.

## 2020-12-16 ENCOUNTER — Encounter: Payer: Self-pay | Admitting: Surgery

## 2020-12-22 ENCOUNTER — Encounter: Payer: Self-pay | Admitting: Surgery

## 2020-12-27 ENCOUNTER — Other Ambulatory Visit: Payer: Self-pay

## 2020-12-27 ENCOUNTER — Ambulatory Visit (INDEPENDENT_AMBULATORY_CARE_PROVIDER_SITE_OTHER): Payer: 59 | Admitting: Physician Assistant

## 2020-12-27 ENCOUNTER — Encounter: Payer: Self-pay | Admitting: Physician Assistant

## 2020-12-27 VITALS — BP 118/77 | HR 100 | Temp 98.8°F | Ht 65.0 in | Wt 201.4 lb

## 2020-12-27 DIAGNOSIS — C50819 Malignant neoplasm of overlapping sites of unspecified female breast: Secondary | ICD-10-CM | POA: Insufficient documentation

## 2020-12-27 DIAGNOSIS — L732 Hidradenitis suppurativa: Secondary | ICD-10-CM

## 2020-12-27 DIAGNOSIS — Z09 Encounter for follow-up examination after completed treatment for conditions other than malignant neoplasm: Secondary | ICD-10-CM

## 2020-12-27 NOTE — Patient Instructions (Signed)
Shower as usual. Keep the wounds covered. Please call to schedule an appointment if the wounds are not healed within 3-4 weeks.

## 2020-12-27 NOTE — Progress Notes (Signed)
[facility] POST-OP OFFICE VISIT  12/27/2020  HPI: Brittney Hendrix is a 35 y.o. female 14 days s/p Incision and Drainage of Hidradenitis of left perineum, left upper inner thigh, and right upper inner thigh with Dr Hampton Abbot  She reports that in the first 48 hours after surgery she noticed the glue cam off and the incisions re-opened. She thinks was this was attributable to showering and returning to work in the first 24 hours after surgery. No fever, chills. She did go to her PCP to have these wounds looked at, and they attempted packing but they are too shallow to do so. No other issues.   Vital signs: BP 118/77   Pulse 100   Temp 98.8 F (37.1 C) (Oral)   Ht '5\' 5"'$  (1.651 m)   Wt 201 lb 6.4 oz (91.4 kg)   LMP 11/15/2020   SpO2 97%   BMI 33.51 kg/m    Physical Exam: Constitutional: Well appearing female, NAD Skin: She has two small 3 cm wounds to the left and right inner proximal thigh which appear to have superficially dehisced, the wound beds are 100% granulation tissue, no erythema or drainage   Assessment/Plan: This is a 35 y.o. female 14 days s/p Incision and Drainage of Hidradenitis of left perineum, left upper inner thigh, and right upper inner thigh   - Will transition to superficial dressings daily + prn  - Reviewed wound care instructions  - No need to for Abx; reviewed signs/symptoms of infection   - Reviewed pathology:  DIAGNOSIS:  A. SKIN AND SOFT TISSUE, RIGHT INNER THIGH; EXCISION:  - EPIDERMAL CYST WITH CHRONIC INFLAMMATION AND SCARRING.   B. SKIN AND SOFT TISSUE, LEFT PERINEAL; EXCISION:  - EPIDERMAL CYST WITH CHRONIC INFLAMMATION AND SCARRING.   C. SKIN AND SOFT TISSUE, LEFT INNER THIGH; EXCISION:  - EPIDERMAL CYST WITH FOCAL FOREIGN BODY REACTION AND INFLAMMATION,  CONSISTENT WITH PREVIOUS RUPTURE   - She will rtc on prn basis   -- Edison Simon, PA-C [facility] 12/27/2020, 10:13 AM [phone] M-F: 7am - 4pm

## 2021-02-18 ENCOUNTER — Ambulatory Visit
Admission: EM | Admit: 2021-02-18 | Discharge: 2021-02-18 | Disposition: A | Discharge status: Home / Self Care | Payer: 59 | Attending: Emergency Medicine | Admitting: Emergency Medicine

## 2021-02-18 ENCOUNTER — Encounter: Payer: Self-pay | Admitting: Emergency Medicine

## 2021-02-18 ENCOUNTER — Other Ambulatory Visit: Payer: Self-pay

## 2021-02-18 DIAGNOSIS — U071 COVID-19: Secondary | ICD-10-CM | POA: Insufficient documentation

## 2021-02-18 DIAGNOSIS — H9202 Otalgia, left ear: Secondary | ICD-10-CM | POA: Insufficient documentation

## 2021-02-18 DIAGNOSIS — J069 Acute upper respiratory infection, unspecified: Secondary | ICD-10-CM

## 2021-02-18 DIAGNOSIS — Z87891 Personal history of nicotine dependence: Secondary | ICD-10-CM | POA: Insufficient documentation

## 2021-02-18 DIAGNOSIS — R519 Headache, unspecified: Secondary | ICD-10-CM | POA: Diagnosis present

## 2021-02-18 LAB — RAPID INFLUENZA A&B ANTIGENS
Influenza A (ARMC): NEGATIVE
Influenza B (ARMC): NEGATIVE

## 2021-02-18 LAB — GROUP A STREP BY PCR: Group A Strep by PCR: NOT DETECTED

## 2021-02-18 MED ORDER — IPRATROPIUM BROMIDE 0.06 % NA SOLN: 2.0000 | Freq: Four times a day (QID) | NASAL | 12 refills | Status: AC

## 2021-02-18 NOTE — ED Provider Notes (Signed)
[facility]    CSN: [id number] Arrival date & time: 02/18/21  1411      History   Chief Complaint Chief Complaint  Patient presents with   Headache   Generalized Body Aches   Nasal Congestion    HPI Brittney Hendrix is a 35 y.o. female.   HPI  35 year old female here for evaluation of respiratory complaints.  Patient reports that for the last days she has been experiencing nasal congestion, headache, body aches, and sore throat.  She is also complaining of pain in her left ear.  Her boyfriend has similar symptoms but his have improved.  She denies fever, nasal discharge, cough, or GI complaints.  Past Medical History:  Diagnosis Date   Breast cancer ([place]) 2015   left, BRCA neg   Depression    Multiple sclerosis ([place]) 2021   Thyroid disease     Patient Active Problem List   Diagnosis Date Noted   Overlapping malignant neoplasm of female breast ([place]) 12/27/2020   Hidradenitis    Recurrent major depression in partial remission ([place]) 12/06/2020   Nausea 08/24/2020   Obesity with body mass index 30 or greater 08/17/2020   Moderate recurrent major depression ([place]) 08/17/2020   Abnormal skin color 08/02/2020   Attention deficit hyperactivity disorder (ADHD) 08/02/2020   Generalized anxiety disorder 02/23/2020   Severe episode of recurrent major depressive disorder, without psychotic features ([place]) 02/23/2020   Deficiency, immunity, 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   Long term current use of immunosuppressive drug 12/19/2019   Spinal cord lesion ([place]) 12/19/2019   Chronic low back pain 02/21/2019   White matter disease 05/01/2018   Abdominal wound dehiscence 07/02/2017   History of breast cancer 07/02/2017   Anemia 08/07/2016   Sleep apnea 08/07/2016   Gastroesophageal reflux disease 08/04/2016   History of tobacco use 08/04/2016   Anxiety 09/30/2015   Left breast mass 07/22/2015   Depression 04/01/2015   Bilateral malignant neoplasm of breast in female  [facility]) 04/01/2015   Acquired absence of breast, bilateral 10/14/2014   Counseling and coordination of care 05/19/2014   Hypothyroidism (acquired) 04/20/2014   Smoking 1/2 pack a day or less 04/07/2014    Past Surgical History:  Procedure Laterality Date   APPENDECTOMY  1999   BREAST RECONSTRUCTION Bilateral 2018   implants   HYDRADENITIS EXCISION Bilateral 12/13/2020   Procedure: EXAM UNDER ANESTHESIA, EXCISION OF LEFT UPPER INNER THIGH HIDRADENITIS, EXCISION OF RIGHT UPPER THIGH HIDRADENITIS;  Surgeon: Olean Ree, MD;  Location: [facility] ORS;  Service: General;  Laterality: Bilateral;   MASTECTOMY Bilateral 2015   TONSILLECTOMY  2003    OB History     Gravida  1   Para  0   Term  0   Preterm  0   AB  0   Living  0      SAB  0   IAB  0   Ectopic  0   Multiple  0   Live Births  0        Obstetric Comments  Menstrual age: 61  Age 1st Pregnancy: N/A                    Home Medications    Prior to Admission medications   Medication Sig Start Date End Date Taking? Authorizing Provider  ipratropium (ATROVENT) 0.06 % nasal spray Place 2 sprays into both nostrils 4 (four) times daily. 02/18/21  Yes Margarette Canada, NP  levothyroxine (SYNTHROID) 125 MCG tablet Take 125  mcg by mouth daily before breakfast.   Yes [provider]  sertraline (ZOLOFT) 25 MG tablet sertraline 25 mg tablet  Take 1 tablet every day by oral route.   Yes [provider]  acetaminophen (TYLENOL) 500 MG tablet Take 1,000 mg by mouth every 6 (six) hours as needed (for pain.).    [provider]  ibuprofen (ADVIL) 200 MG tablet Take 400 mg by mouth every 8 (eight) hours as needed (pain.).    [provider]  ibuprofen (ADVIL) 800 MG tablet Take 1 tablet (800 mg total) by mouth every 8 (eight) hours as needed for moderate pain. 12/13/20   Olean Ree, MD    Family History History reviewed. No pertinent family history.  Social History Social  History   Tobacco Use   Smoking status: Former    Packs/day: 1.00    Types: Cigarettes   Smokeless tobacco: Never  Vaping Use   Vaping Use: Never used  Substance Use Topics   Alcohol use: Yes    Comment: occasional   Drug use: Never     Allergies   Prochlorperazine and Nicotine   Review of Systems Review of Systems  Constitutional:  Negative for activity change, appetite change and fever.  HENT:  Positive for congestion, ear pain and sore throat. Negative for rhinorrhea.   Respiratory:  Negative for cough, shortness of breath and wheezing.   Gastrointestinal:  Negative for diarrhea, nausea and vomiting.  Musculoskeletal:  Positive for arthralgias and myalgias.  Skin:  Negative for rash.  Neurological:  Positive for headaches.  Hematological: Negative.   Psychiatric/Behavioral: Negative.      Physical Exam Triage Vital Signs ED Triage Vitals  Enc Vitals Group     BP 02/18/21 1507 121/74     Pulse Rate 02/18/21 1507 71     Resp 02/18/21 1507 14     Temp 02/18/21 1507 98 F (36.7 C)     Temp Source 02/18/21 1507 Oral     SpO2 02/18/21 1507 100 %     Weight 02/18/21 1501 204 lb (92.5 kg)     Height 02/18/21 1501 5' 5"  (1.651 m)     Head Circumference --      Peak Flow --      Pain Score 02/18/21 1500 8     Pain Loc --      Pain Edu? --      Excl. in [place]? --    No data found.  Updated Vital Signs BP 121/74 (BP Location: Right Arm)   Pulse 71   Temp 98 F (36.7 C) (Oral)   Resp 14   Ht 5' 5"  (1.651 m)   Wt 204 lb (92.5 kg)   LMP 11/15/2020   SpO2 100%   BMI 33.95 kg/m   Visual Acuity Right Eye Distance:   Left Eye Distance:   Bilateral Distance:    Right Eye Near:   Left Eye Near:    Bilateral Near:     Physical Exam Vitals and nursing note reviewed.  Constitutional:      General: She is not in acute distress.    Appearance: Normal appearance. She is not ill-appearing.  HENT:     Head: Normocephalic and atraumatic.     Right Ear: Tympanic  membrane, ear canal and external ear normal. There is no impacted cerumen.     Left Ear: Tympanic membrane, ear canal and external ear normal. There is no impacted cerumen.     Nose: Congestion and  rhinorrhea present.     Mouth/Throat:     Mouth: Mucous membranes are moist.     Pharynx: Oropharynx is clear. Posterior oropharyngeal erythema present.  Cardiovascular:     Rate and Rhythm: Normal rate and regular rhythm.     Pulses: Normal pulses.     Heart sounds: Normal heart sounds. No murmur heard.   No gallop.  Pulmonary:     Effort: Pulmonary effort is normal.     Breath sounds: Normal breath sounds. No wheezing, rhonchi or rales.  Musculoskeletal:     Cervical back: Normal range of motion and neck supple.  Lymphadenopathy:     Cervical: No cervical adenopathy.  Skin:    General: Skin is warm and dry.     Capillary Refill: Capillary refill takes less than 2 seconds.     Findings: No erythema or rash.  Neurological:     General: No focal deficit present.     Mental Status: She is alert and oriented to person, place, and time.  Psychiatric:        Mood and Affect: Mood normal.        Behavior: Behavior normal.        Thought Content: Thought content normal.        Judgment: Judgment normal.     UC Treatments / Results  Labs (all labs ordered are listed, but only abnormal results are displayed) Labs Reviewed  GROUP A STREP BY PCR  RAPID INFLUENZA A&B ANTIGENS  SARS CORONAVIRUS 2 (TAT 6-24 HRS)    EKG   Radiology No results found.  Procedures Procedures (including critical care time)  Medications Ordered in UC Medications - No data to display  Initial Impression / Assessment and Plan / UC Course  I have reviewed the triage vital signs and the nursing notes.  Pertinent labs & imaging results that were available during my care of the patient were reviewed by me and considered in my medical decision making (see chart for details).  Patient is a very pleasant,  nontoxic-appearing 35 year old female here for evaluation of upper respiratory complaints as outlined in the HPI above.  Patient's physical exam reveals pearly gray tympanic membranes bilaterally with normal light reflex and clear external auditory canals.  Nasal mucosa is erythematous and edematous with clear nasal discharge in both nares.  Oropharyngeal exam reveals mild posterior oropharyngeal erythema with clear postnasal drip.  No cervical adenopathy appreciated on exam.  Cardiopulmonary exam reveals clear lung sounds in all fields.  Patient swabbed for influenza, strep, and COVID at triage.  Influenza and strep are negative.  COVID is pending.  Will discharge patient home to isolate pending the results of her COVID test.  Will give Atrovent nasal spray to help with the nasal congestion.  Patient to use over-the-counter Tylenol and ibuprofen as needed for body aches and headache, salt water gargles and Sucrets lozenges as needed for sore throat.   Final Clinical Impressions(s) / UC Diagnoses   Final diagnoses:  Upper respiratory tract infection, unspecified type     Discharge Instructions      Isolate at home pending the results of your COVID test.  If you test positive then you will have to quarantine for 5 days from the start of your symptoms.  After 5 days you can break quarantine if your symptoms have improved and you have not had a fever for 24 hours without taking Tylenol or ibuprofen.  Use over-the-counter Tylenol and ibuprofen as needed for body aches and fever.  Use the Atrovent nasal spray, 2 squirts in each nostril every 4-6 hours, as needed for nasal congestion.  Gargle with warm salt water 2-3 times a day to soothe your throat, aid in pain relief, and aid in healing.  Take over-the-counter ibuprofen according to the package instructions as needed for pain.  You can also use Chloraseptic or Sucrets lozenges, 1 lozenge every 2 hours as needed for throat pain.  If you develop  any new or worsening symptoms return for reevaluation.   If you develop any increased shortness of breath-especially at rest, you are unable to speak in full sentences, or is a late sign your lips are turning blue you need to go the ER for evaluation.      ED Prescriptions     Medication Sig Dispense Auth. Provider   ipratropium (ATROVENT) 0.06 % nasal spray Place 2 sprays into both nostrils 4 (four) times daily. 15 mL Margarette Canada, NP      PDMP not reviewed this encounter.   Margarette Canada, NP 02/18/21 [phone]

## 2021-02-18 NOTE — Discharge Instructions (Signed)
Isolate at home pending the results of your COVID test.  If you test positive then you will have to quarantine for 5 days from the start of your symptoms.  After 5 days you can break quarantine if your symptoms have improved and you have not had a fever for 24 hours without taking Tylenol or ibuprofen.  Use over-the-counter Tylenol and ibuprofen as needed for body aches and fever.  Use the Atrovent nasal spray, 2 squirts in each nostril every 4-6 hours, as needed for nasal congestion.  Gargle with warm salt water 2-3 times a day to soothe your throat, aid in pain relief, and aid in healing.  Take over-the-counter ibuprofen according to the package instructions as needed for pain.  You can also use Chloraseptic or Sucrets lozenges, 1 lozenge every 2 hours as needed for throat pain.  If you develop any new or worsening symptoms return for reevaluation.   If you develop any increased shortness of breath-especially at rest, you are unable to speak in full sentences, or is a late sign your lips are turning blue you need to go the ER for evaluation.

## 2021-02-18 NOTE — ED Triage Notes (Signed)
Patient c/o sore throat, stuffy nose, headache and bodyaches that started yesterday.  Patient denies fevers.

## 2021-02-19 ENCOUNTER — Telehealth: Payer: Self-pay | Admitting: Emergency Medicine

## 2021-02-19 LAB — SARS CORONAVIRUS 2 (TAT 6-24 HRS): SARS Coronavirus 2: POSITIVE — AB

## 2021-02-19 MED ORDER — NIRMATRELVIR/RITONAVIR (PAXLOVID)TABLET: 3.0000 | ORAL_TABLET | Freq: Two times a day (BID) | ORAL | 0 refills | Status: AC

## 2021-02-19 NOTE — Telephone Encounter (Signed)
Patient tested positive for COVID-19 and she has significant comorbidities to include hypothyroidism and breast cancer.  Most recent blood work is from June 2022 which indicates her GFR is 96.  Will prescribe Paxlovid for treatment of the COVID-19.  Prescription sent to pharmacy.

## 2021-02-19 NOTE — Telephone Encounter (Signed)
Spoke to patient and verified name and DOB.  Patient was informed that her COVID test was positive.  Patient states that she is [redacted] weeks gestation.  Patient was informed not to take the Paxlovid due to pregnancy.  Patient to quarantine for 5 days from the start of her symptoms.  Patient to monitor her symptoms and if her symptoms worsen or have SOB to follow-up at the ER.  Patient verbalized understanding.

## 2021-04-14 ENCOUNTER — Ambulatory Visit: Payer: 59 | Admitting: Dermatology

## 2022-10-27 IMAGING — US US ABDOMEN COMPLETE
1 series · 14 of 25 positions shown · non-contrast
Comparison: None.

CLINICAL DATA: Right upper quadrant pain and nausea.

EXAM:
ABDOMEN ULTRASOUND COMPLETE

[Series 1: us abdomen complete · 14 of 83 slices shown]
[im 1/83]
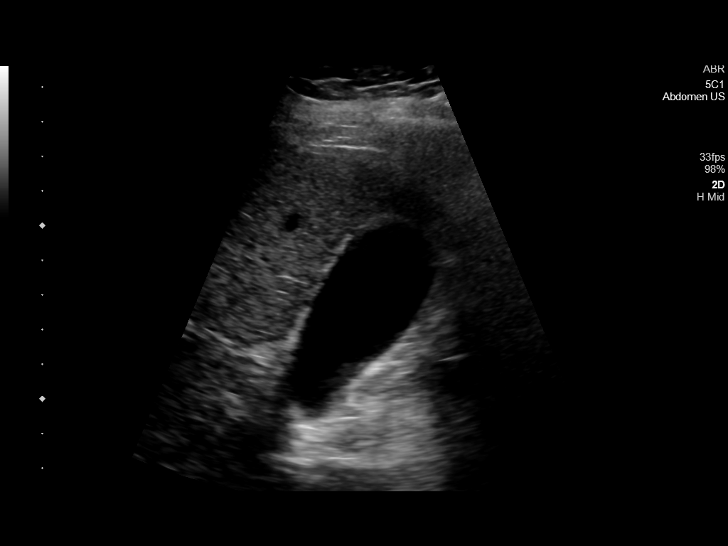
[im 7/83]
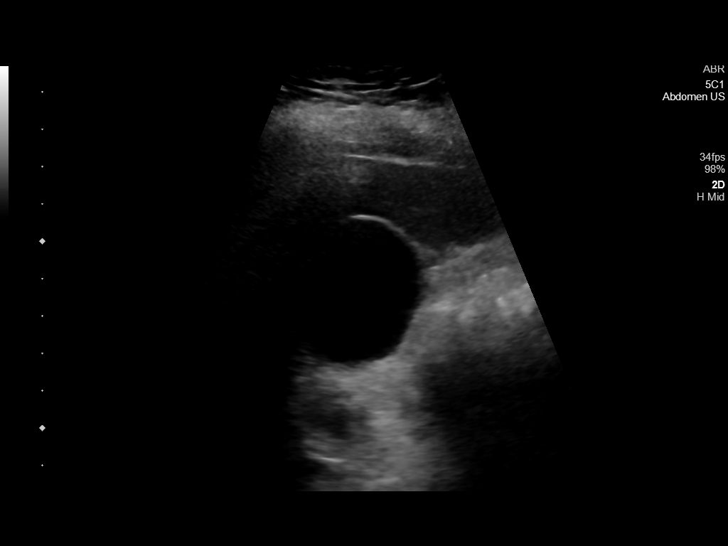
[im 14/83]
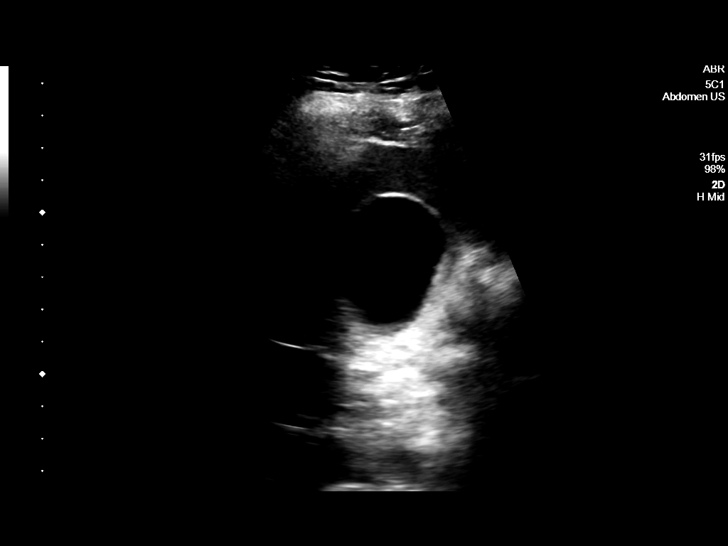
[im 21/83]
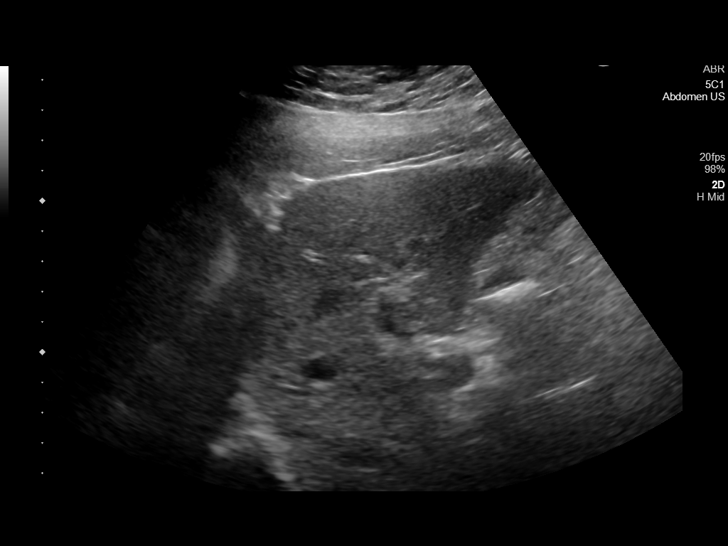
[im 28/83]
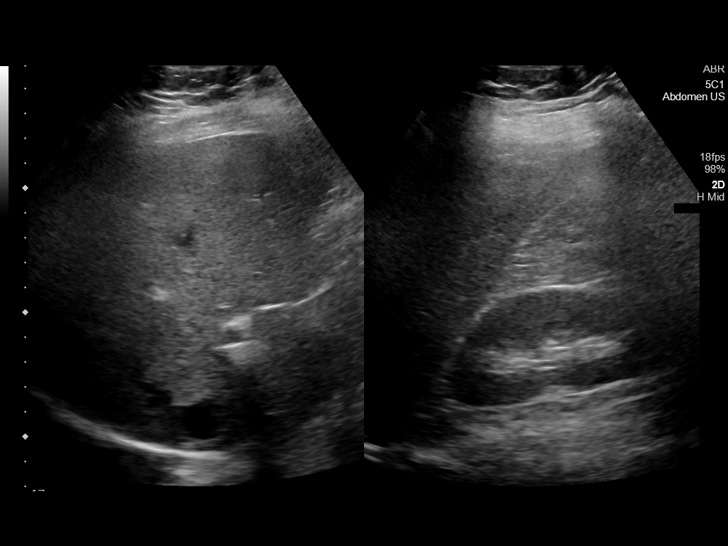
[im 31/83]
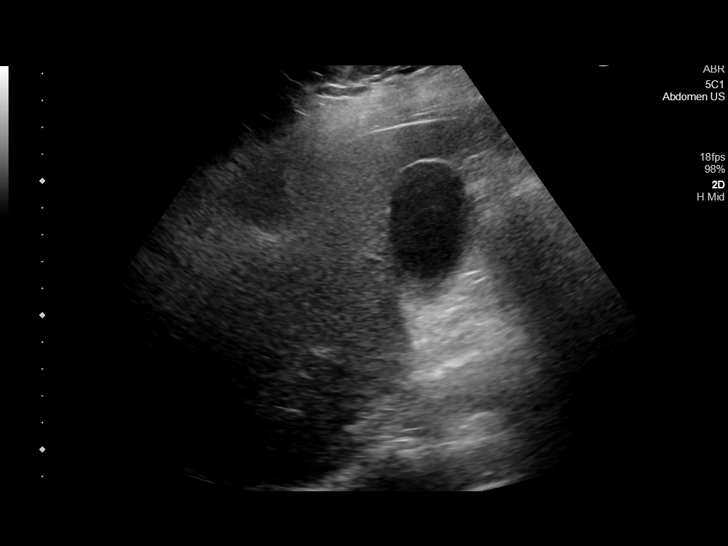
[im 38/83]
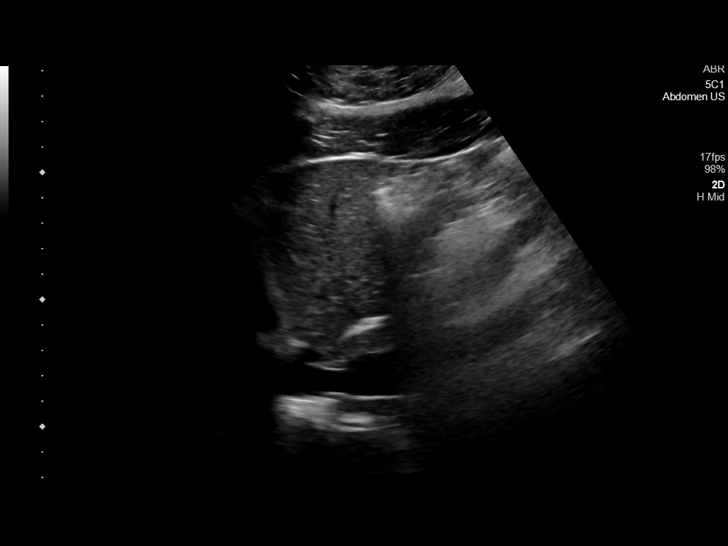
[im 45/83]
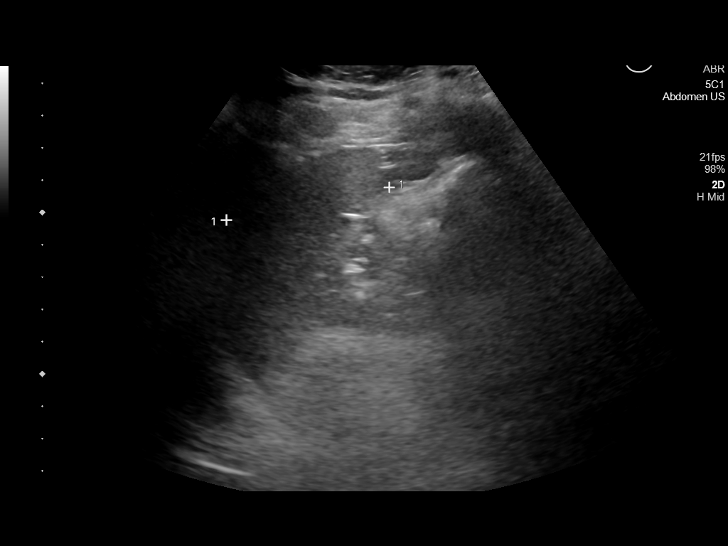
[im 52/83]
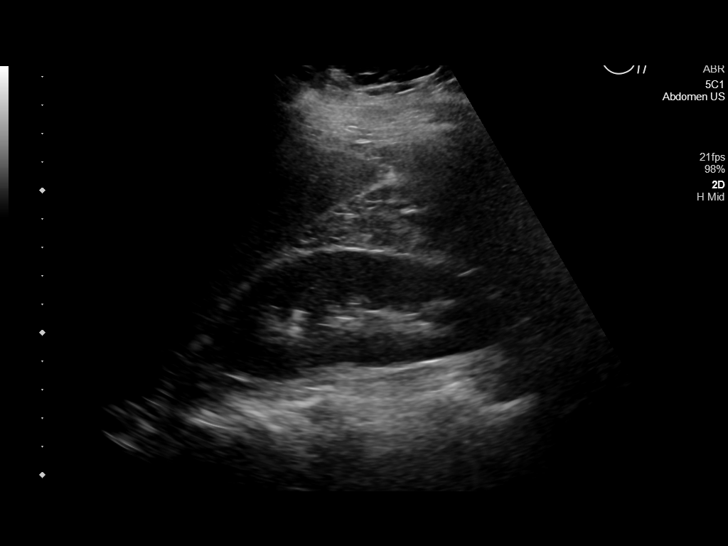
[im 55/83]
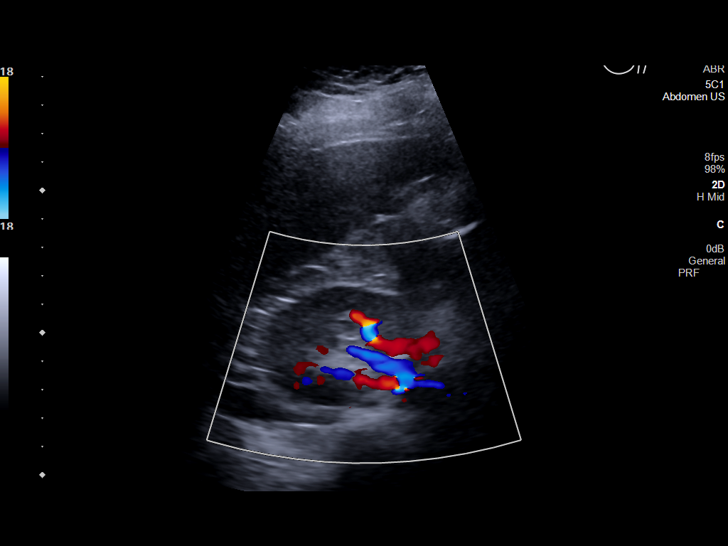
[im 62/83]
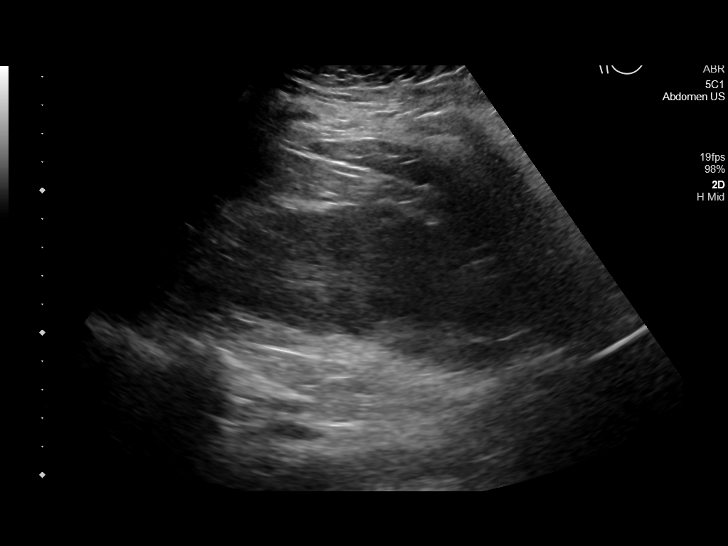
[im 69/83]
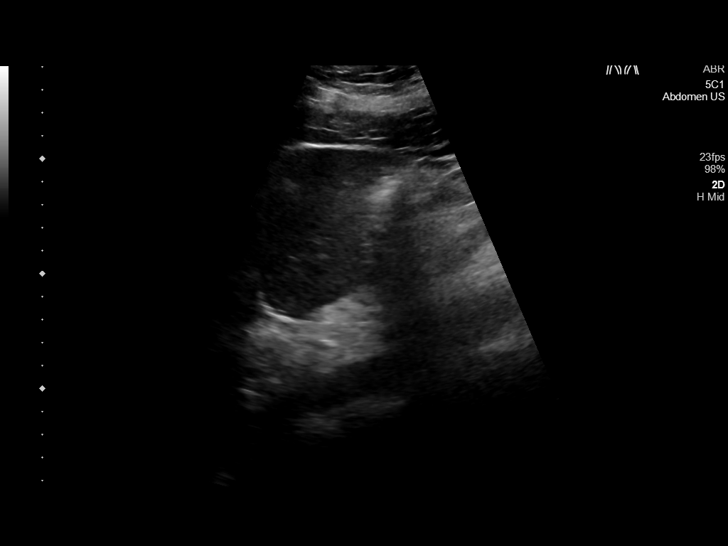
[im 76/83]
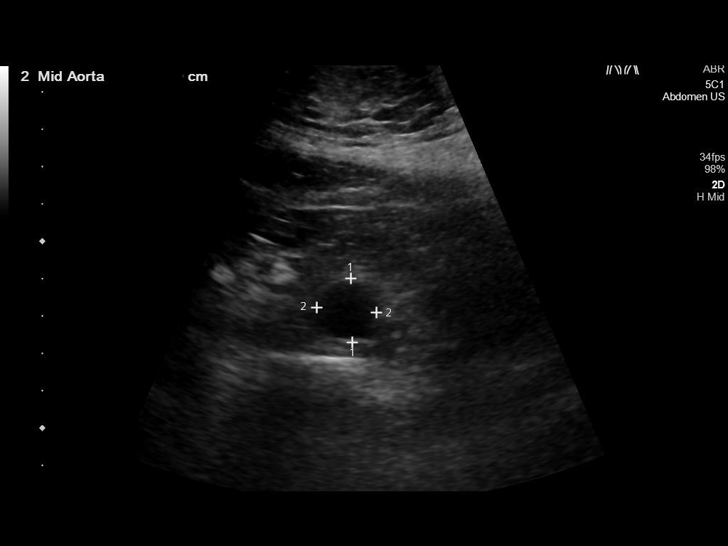
[im 83/83]
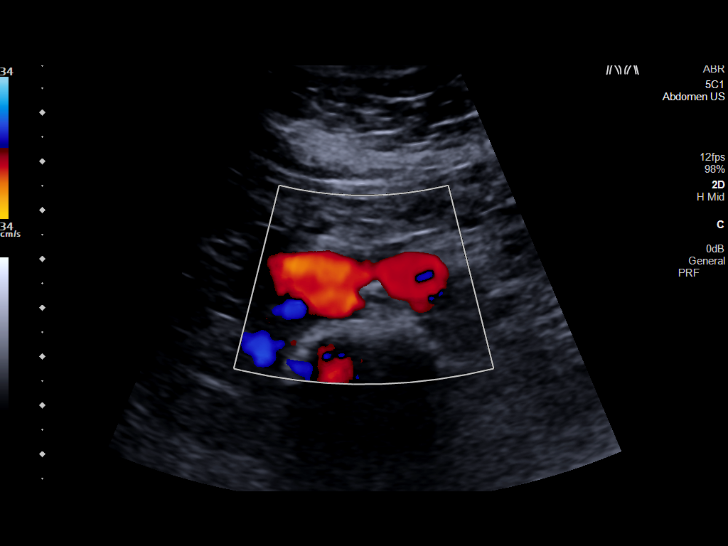

[14 of 25 positions shown; findings below may reference images not displayed]

FINDINGS: Gallbladder: No gallstones or wall thickening visualized. No
sonographic Murphy sign noted by sonographer.

Common bile duct: Diameter: 3 mm

Liver: Mildly increased in echogenicity. No focal lesion. Portal
vein is patent on color Doppler imaging with normal direction of
blood flow towards the liver.

IVC: No abnormality visualized.

Pancreas: Visualized portion unremarkable.

Spleen: Size and appearance within normal limits.

Right Kidney: Length: 10.5 cm. Echogenicity within normal limits. No
mass or hydronephrosis visualized.

Left Kidney: Length: 11.9 cm. Echogenicity within normal limits. No
mass or hydronephrosis visualized.

Abdominal aorta: No aneurysm visualized.

Other findings: None.
IMPRESSION: No cholelithiasis or secondary signs to suggest acute cholecystitis.

Mild hepatic steatosis.
